# Patient Record
Sex: Female | Born: 2000 | Race: White | Hispanic: No | Marital: Single | Smoking: Never smoker
Health system: Southern US, Community
[De-identification: ages and names within clinical notes are randomized; demographics above are authoritative.]

## PROBLEM LIST (undated history)

## (undated) DIAGNOSIS — F909 Attention-deficit hyperactivity disorder, unspecified type: Secondary | ICD-10-CM

## (undated) DIAGNOSIS — F32A Depression, unspecified: Secondary | ICD-10-CM

## (undated) DIAGNOSIS — F419 Anxiety disorder, unspecified: Secondary | ICD-10-CM

## (undated) HISTORY — DX: Attention-deficit hyperactivity disorder, unspecified type: F90.9

## (undated) HISTORY — DX: Anxiety disorder, unspecified: F41.9

## (undated) HISTORY — DX: Depression, unspecified: F32.A

---

## 2021-06-03 ENCOUNTER — Ambulatory Visit (INDEPENDENT_AMBULATORY_CARE_PROVIDER_SITE_OTHER): Payer: BC Managed Care – PPO | Admitting: Internal Medicine

## 2021-06-03 ENCOUNTER — Encounter: Payer: Self-pay | Admitting: Internal Medicine

## 2021-06-03 VITALS — BP 116/68 | HR 84 | Temp 97.4°F | Resp 16 | Ht 65.0 in | Wt 137.3 lb

## 2021-06-03 DIAGNOSIS — Z114 Encounter for screening for human immunodeficiency virus [HIV]: Secondary | ICD-10-CM | POA: Diagnosis not present

## 2021-06-03 DIAGNOSIS — R61 Generalized hyperhidrosis: Secondary | ICD-10-CM | POA: Diagnosis not present

## 2021-06-03 DIAGNOSIS — L853 Xerosis cutis: Secondary | ICD-10-CM | POA: Diagnosis not present

## 2021-06-03 DIAGNOSIS — Z23 Encounter for immunization: Secondary | ICD-10-CM | POA: Diagnosis not present

## 2021-06-03 DIAGNOSIS — F419 Anxiety disorder, unspecified: Secondary | ICD-10-CM | POA: Diagnosis not present

## 2021-06-03 DIAGNOSIS — Z1159 Encounter for screening for other viral diseases: Secondary | ICD-10-CM | POA: Diagnosis not present

## 2021-06-03 NOTE — Patient Instructions (Addendum)
It was great seeing you today!  Plan discussed at today's visit: -Blood work ordered today, results will be uploaded to [facility].  -Tdap booster given today  Follow up in: as needed, schedule appointment for Pap whenever is convenient    Take care and let us know if you have any questions or concerns prior to your next visit.  Dr. Rosana Berger

## 2021-06-03 NOTE — Progress Notes (Signed)
New Patient Office Visit  Subjective:  Patient ID: Kaitlin Johnson, female    DOB: [birth year]  Age: 21 y.o. MRN: [id number]  CC:  Chief Complaint  Patient presents with   Establish Care   Alopecia    Scalp dry/flacky/itchy   Excessive Sweating    HPI Kaitlin Johnson presents as a new patient. Past medical history includes MDD/anxiety and ADHD. She currently is not on any daily medications. 2 separate issues today: Dry skin on scalp and excessive sweating, particularly at night.   Dry skin: Noticed lately, worse on scalp, concern for psoriasis. Scalp is oily and is currently washing it everyday but then becomes dry, scaly and itchy. Switching shampoos but hasn't noticed too much of a change. Just washed hair so no issues currently.  Excessive sweating: like hot flashes, worse at night. Happens almost every night for 1 year. Not getting worse or better. Having some irregular periods but does have a period about every 28 days, some heavy bleeding, lasts about 5-6 days. Was previously on OCP which regulated periods but she stopped taking this in 2019 and does not want to be on birth control. LMP 3 days ago. Does have a history of anxiety which has been a little worse since moving to [place] 2 years ago from [place].   Health Maintenance: -Blood work due -Never had a Pap, up to date with HPV vaccines -Politely declines flu shot today but due to Tdap booster  Past Medical History:  Diagnosis Date   ADHD    Anxiety    Depression     History reviewed. No pertinent surgical history.   History reviewed. No pertinent family history. Heart disease   Social History   Socioeconomic History   Marital status: Single    Spouse name: Not on file   Number of children: Not on file   Years of education: Not on file   Highest education level: Not on file  Occupational History   Not on file  Tobacco Use   Smoking status: Never   Smokeless tobacco: Never  Vaping Use   Vaping Use: Some days    Substances: Nicotine  Substance and Sexual Activity   Alcohol use: Yes    Comment: 10 glasses of wine or beer per week   Drug use: Yes    Types: Marijuana   Sexual activity: Yes  Other Topics Concern   Not on file  Social History Narrative   Not on file   Social Determinants of Health   Financial Resource Strain: Not on file  Food Insecurity: Not on file  Transportation Needs: Not on file  Physical Activity: Not on file  Stress: Not on file  Social Connections: Not on file  Intimate Partner Violence: Not on file    ROS Review of Systems  Constitutional:  Positive for fatigue. Negative for chills, fever and unexpected weight change.  Eyes:  Negative for visual disturbance.  Respiratory:  Negative for cough.   Cardiovascular:  Negative for chest pain.  Gastrointestinal:  Negative for abdominal pain.  Genitourinary:  Positive for vaginal bleeding. Negative for menstrual problem.  Neurological:  Negative for dizziness and headaches.   Objective:   Today's Vitals: BP 116/68    Pulse 84    Temp (!) 97.4 F (36.3 C)    Resp 16    Ht 5\' 5"  (1.651 m)    Wt 137 lb 4.8 oz (62.3 kg)    LMP 06/01/2021    SpO2 98%  BMI 22.85 kg/m   Physical Exam Constitutional:      Appearance: Normal appearance.  HENT:     Head: Normocephalic and atraumatic.  Eyes:     Conjunctiva/sclera: Conjunctivae normal.  Cardiovascular:     Rate and Rhythm: Normal rate and regular rhythm.  Pulmonary:     Effort: Pulmonary effort is normal.     Breath sounds: Normal breath sounds.  Musculoskeletal:     Right lower leg: No edema.     Left lower leg: No edema.  Skin:    General: Skin is warm and dry.     Comments: Scalp clean without excoriations, scabbing, scales or skin changes. Does not appear particularly dry, no flaking but she just washed hair.   Neurological:     General: No focal deficit present.     Mental Status: She is alert. Mental status is at baseline.  Psychiatric:        Mood and  Affect: Mood normal.        Behavior: Behavior normal.    Assessment & Plan:   1. Excessive sweating/Anxiety: Night sweats primarily. Will obtain routine blood work to rule out metabolic causes but discussed the possibly of anxiety manifesting in this way. Discussed medications but most importantly considering counseling or therapy.  - CBC w/Diff/Platelet - COMPLETE METABOLIC PANEL WITH GFR - TSH  2. Dry skin: No evidence of dry skin or skin pathology on scalp currently although she did just wash it. Consider Selsun blue shampoo and washing hair everyday with a good conditioner/and or scalp oil. Check TSH to rule out thyroid disease.  - TSH  3. Need for hepatitis C screening test/Encounter for screening for HIV: Screen for HIV/Hepatitis C with above labs.   - Hepatitis C Antibody - HIV antibody (with reflex)  4. Need for Tdap vaccination: Tdap booster today.  - Tdap vaccine greater than or equal to 7yo IM   Follow-up: Return if symptoms worsen or fail to improve, for schedule Pap whenever works for her.   Teodora Medici, DO

## 2021-06-04 LAB — CBC WITH DIFFERENTIAL/PLATELET
Absolute Monocytes: 832 cells/uL (ref 200–950)
Basophils Absolute: 13 cells/uL (ref 0–200)
Basophils Relative: 0.2 %
Eosinophils Absolute: 39 cells/uL (ref 15–500)
Eosinophils Relative: 0.6 %
HCT: 38.7 % (ref 35.0–45.0)
Hemoglobin: 13.1 g/dL (ref 11.7–15.5)
Lymphs Abs: 2009 cells/uL (ref 850–3900)
MCH: 31.6 pg (ref 27.0–33.0)
MCHC: 33.9 g/dL (ref 32.0–36.0)
MCV: 93.5 fL (ref 80.0–100.0)
MPV: 11.2 fL (ref 7.5–12.5)
Monocytes Relative: 12.8 %
Neutro Abs: 3608 cells/uL (ref 1500–7800)
Neutrophils Relative %: 55.5 %
Platelets: 290 10*3/uL (ref 140–400)
RBC: 4.14 10*6/uL (ref 3.80–5.10)
RDW: 12.3 % (ref 11.0–15.0)
Total Lymphocyte: 30.9 %
WBC: 6.5 10*3/uL (ref 3.8–10.8)

## 2021-06-04 LAB — COMPLETE METABOLIC PANEL WITH GFR
AG Ratio: 1.9 (calc) (ref 1.0–2.5)
ALT: 12 U/L (ref 6–29)
AST: 20 U/L (ref 10–30)
Albumin: 5 g/dL (ref 3.6–5.1)
Alkaline phosphatase (APISO): 36 U/L (ref 31–125)
BUN: 12 mg/dL (ref 7–25)
CO2: 26 mmol/L (ref 20–32)
Calcium: 10 mg/dL (ref 8.6–10.2)
Chloride: 104 mmol/L (ref 98–110)
Creat: 0.72 mg/dL (ref 0.50–0.96)
Globulin: 2.6 g/dL (calc) (ref 1.9–3.7)
Glucose, Bld: 80 mg/dL (ref 65–99)
Potassium: 4.3 mmol/L (ref 3.5–5.3)
Sodium: 140 mmol/L (ref 135–146)
Total Bilirubin: 0.9 mg/dL (ref 0.2–1.2)
Total Protein: 7.6 g/dL (ref 6.1–8.1)
eGFR: 122 mL/min/{1.73_m2} (ref >=60)

## 2021-06-04 LAB — HIV ANTIBODY (ROUTINE TESTING W REFLEX): HIV 1&2 Ab, 4th Generation: NONREACTIVE

## 2021-06-04 LAB — HEPATITIS C ANTIBODY
Hepatitis C Ab: NONREACTIVE
SIGNAL TO CUT-OFF: 0.07 (ref <1.00)

## 2021-06-04 LAB — TSH: TSH: 1.12 mIU/L

## 2021-06-24 ENCOUNTER — Ambulatory Visit (INDEPENDENT_AMBULATORY_CARE_PROVIDER_SITE_OTHER): Payer: BC Managed Care – PPO | Admitting: Internal Medicine

## 2021-06-24 ENCOUNTER — Other Ambulatory Visit (HOSPITAL_COMMUNITY)
Admission: RE | Admit: 2021-06-24 | Discharge: 2021-06-24 | Disposition: A | Discharge status: Home / Self Care | Payer: BC Managed Care – PPO | Source: Ambulatory Visit | Attending: Internal Medicine | Admitting: Internal Medicine

## 2021-06-24 ENCOUNTER — Encounter: Payer: Self-pay | Admitting: Internal Medicine

## 2021-06-24 VITALS — BP 118/78 | HR 87 | Temp 98.2°F | Resp 16 | Ht 65.0 in | Wt 138.2 lb

## 2021-06-24 DIAGNOSIS — R87612 Low grade squamous intraepithelial lesion on cytologic smear of cervix (LGSIL): Secondary | ICD-10-CM | POA: Diagnosis not present

## 2021-06-24 DIAGNOSIS — Z124 Encounter for screening for malignant neoplasm of cervix: Secondary | ICD-10-CM

## 2021-06-24 NOTE — Progress Notes (Signed)
Established Patient Office Visit  Subjective:  Patient ID: Kaitlin Johnson, female    DOB: 2001-01-25  Age: 21 y.o. MRN: [id number]  CC:  Chief Complaint  Patient presents with   Gynecologic Exam    HPI Kaitlin Johnson presents for Pap.   SUBJECTIVE:  21 y.o. female for annual routine Pap and checkup. Periods are regular without issues. Denies concerns for STDs. Denies abnormal vaginal pain or discharge. Overall no concerns.   Past Medical History:  Diagnosis Date   ADHD    Anxiety    Depression     Social History   Socioeconomic History   Marital status: Single    Spouse name: Not on file   Number of children: Not on file   Years of education: Not on file   Highest education level: Not on file  Occupational History   Not on file  Tobacco Use   Smoking status: Never   Smokeless tobacco: Never  Vaping Use   Vaping Use: Some days   Substances: Nicotine  Substance and Sexual Activity   Alcohol use: Yes    Comment: 10 glasses of wine or beer per week   Drug use: Yes    Types: Marijuana   Sexual activity: Yes  Other Topics Concern   Not on file  Social History Narrative   Not on file   Social Determinants of Health   Financial Resource Strain: Not on file  Food Insecurity: Not on file  Transportation Needs: Not on file  Physical Activity: Not on file  Stress: Not on file  Social Connections: Not on file  Intimate Partner Violence: Not on file    No outpatient medications prior to visit.   No facility-administered medications prior to visit.    Allergies: Patient has no known allergies.  Patient's last menstrual period was 06/01/2021.  ROS:  Feeling well. No dyspnea or chest pain on exertion.  No abdominal pain, change in bowel habits, black or bloody stools.  No urinary tract symptoms. GYN ROS: normal menses, no abnormal bleeding, pelvic pain or discharge. No neurological complaints.    Lab Results  Component Value Date   TSH 1.12 06/03/2021    Lab Results  Component Value Date   WBC 6.5 06/03/2021   HGB 13.1 06/03/2021   HCT 38.7 06/03/2021   MCV 93.5 06/03/2021   PLT 290 06/03/2021   Lab Results  Component Value Date   NA 140 06/03/2021   K 4.3 06/03/2021   CO2 26 06/03/2021   GLUCOSE 80 06/03/2021   BUN 12 06/03/2021   CREATININE 0.72 06/03/2021   BILITOT 0.9 06/03/2021   AST 20 06/03/2021   ALT 12 06/03/2021   PROT 7.6 06/03/2021   CALCIUM 10.0 06/03/2021   EGFR 122 06/03/2021   OBJECTIVE:  Vitals: BP 118/78    Pulse 87    Temp 98.2 F (36.8 C)    Resp 16    Ht 5' 5"  (1.651 m)    Wt 138 lb 3.2 oz (62.7 kg)    LMP 06/01/2021    SpO2 97%    BMI 23.00 kg/m   The patient appears well, alert, oriented x 3, in no distress. ENT normal.  Neck supple. No adenopathy or thyromegaly. PERLA. Lungs are clear, good air entry, no wheezes, rhonchi or rales. S1 and S2 normal, no murmurs, regular rate and rhythm. Abdomen soft without tenderness, guarding, mass or organomegaly. Extremities show no edema, normal peripheral pulses. Neurological is normal, no focal findings.  PELVIC  EXAM: normal external genitalia, vulva, vagina, cervix, uterus and adnexa.    Lab Results  Component Value Date   TSH 1.12 06/03/2021   Lab Results  Component Value Date   WBC 6.5 06/03/2021   HGB 13.1 06/03/2021   HCT 38.7 06/03/2021   MCV 93.5 06/03/2021   PLT 290 06/03/2021   Lab Results  Component Value Date   NA 140 06/03/2021   K 4.3 06/03/2021   CO2 26 06/03/2021   GLUCOSE 80 06/03/2021   BUN 12 06/03/2021   CREATININE 0.72 06/03/2021   BILITOT 0.9 06/03/2021   AST 20 06/03/2021   ALT 12 06/03/2021   PROT 7.6 06/03/2021   CALCIUM 10.0 06/03/2021   EGFR 122 06/03/2021   No results found for: CHOL No results found for: HDL No results found for: LDLCALC No results found for: TRIG No results found for: CHOLHDL No results found for: HGBA1C    Assessment & Plan:   1. Cervical cancer screening: Pap performed today. Follow  up in 1 year for routine medical follow up.  - Cytology - PAP  Follow-up: Return in about 1 year (around 06/24/2022).    Teodora Medici, DO

## 2021-06-24 NOTE — Patient Instructions (Addendum)
It was great seeing you today!  Plan discussed at today's visit: -Pap today, we will call you or send a message via MyChart with results.   Follow up in: 1 year for routine medical care or sooner as needed  Take care and let us know if you have any questions or concerns prior to your next visit.  Dr. Caralee Ates

## 2021-06-27 LAB — CYTOLOGY - PAP
Comment: NEGATIVE
High risk HPV: POSITIVE — AB

## 2021-06-28 ENCOUNTER — Telehealth: Payer: Self-pay

## 2021-06-28 NOTE — Telephone Encounter (Signed)
Copied from CRM [id number]. Topic: General - Other >> Jun 28, 2021  3:08 PM Kaitlin Johnson wrote: Reason for CRM: Pt requests return call regarding questions/concerns about her lab results. Cb# [phone]

## 2021-06-28 NOTE — Addendum Note (Signed)
Addended by: Margarita Mail on: 06/28/2021 10:20 AM   Modules accepted: Orders

## 2021-06-28 NOTE — Telephone Encounter (Signed)
Pt.notified

## 2021-07-01 ENCOUNTER — Telehealth: Payer: Self-pay

## 2021-07-01 NOTE — Telephone Encounter (Signed)
[facility] referring for Needs colpo, abnormal Pap with LSIL and HPV. MD only. Called and left voicemail for patient to call back to be scheduled.

## 2021-07-01 NOTE — Telephone Encounter (Signed)
Patient is scheduled 08/05/21 with RPH at 2:55

## 2021-08-05 ENCOUNTER — Ambulatory Visit (INDEPENDENT_AMBULATORY_CARE_PROVIDER_SITE_OTHER): Payer: BC Managed Care – PPO | Admitting: Obstetrics & Gynecology

## 2021-08-05 ENCOUNTER — Encounter: Payer: Self-pay | Admitting: Obstetrics & Gynecology

## 2021-08-05 ENCOUNTER — Other Ambulatory Visit: Payer: Self-pay

## 2021-08-05 ENCOUNTER — Other Ambulatory Visit (HOSPITAL_COMMUNITY)
Admission: RE | Admit: 2021-08-05 | Discharge: 2021-08-05 | Disposition: A | Discharge status: Home / Self Care | Payer: BC Managed Care – PPO | Source: Ambulatory Visit | Attending: Obstetrics & Gynecology | Admitting: Obstetrics & Gynecology

## 2021-08-05 VITALS — BP 120/80 | Ht 65.0 in | Wt 141.0 lb

## 2021-08-05 DIAGNOSIS — N870 Mild cervical dysplasia: Secondary | ICD-10-CM | POA: Diagnosis not present

## 2021-08-05 DIAGNOSIS — R87612 Low grade squamous intraepithelial lesion on cytologic smear of cervix (LGSIL): Secondary | ICD-10-CM | POA: Diagnosis not present

## 2021-08-05 NOTE — Progress Notes (Signed)
?  Referring Provider:  Dr Caralee Ates ? ?HPI:  ?Kaitlin Johnson is a 21 y.o.  No obstetric history on file.  who presents today for evaluation and management of abnormal cervical cytology.   ? ?Dysplasia History:  LGSIL first PAP ?Also, HPV POS ? ?Pt also reports 2 mos h/o intermittent thigh,mons,labial lesions, that are small and do not itch or hurt ? ?ROS:  Pertinent items are noted in HPI. ? ?OB History  ?No obstetric history on file.  ? ? ?Past Medical History:  ?Diagnosis Date  ? ADHD   ? Anxiety   ? Depression   ? ? ?History reviewed. No pertinent surgical history. ? ?SOCIAL HISTORY: ? ?Social History  ? ?Substance and Sexual Activity  ?Alcohol Use Yes  ? Comment: 10 glasses of wine or beer per week  ? ? ?Social History  ? ?Substance and Sexual Activity  ?Drug Use Yes  ? Types: Marijuana  ? ? ? ?History reviewed. No pertinent family history. ? ?ALLERGIES:  Patient has no known allergies. ? ?No current outpatient medications on file prior to visit.  ? ?No current facility-administered medications on file prior to visit.  ? ? ?Physical Exam: ?-Vitals:  ?BP 120/80   Ht 5\' 5"  (1.651 m)   Wt 141 lb (64 kg)   LMP 07/23/2021   BMI 23.46 kg/m?  ?GEN: WD, WN, NAD.  A+ O x 3, good mood and affect. ?ABD:  NT, ND.  Soft, no masses.  No hernias noted. ?  Pelvic:   ?Vulva: Normal appearance.  3 small skin lesions noted, NOT c/w GENITAL WARTS or MOLLUSCUM.  ?Vagina: No lesions or abnormalities noted.  ?Support: Normal pelvic support.  ?Urethra No masses tenderness or scarring.  ?Meatus Normal size without lesions or prolapse.  ?Cervix: See below.  ?Anus: Normal exam.  No lesions.  ?Perineum: Normal exam.  No lesions.  ?      Bimanual   ?Uterus: Normal size.  Non-tender.  Mobile.  AV.  ?Adnexae: No masses.  Non-tender to palpation.  ?Cul-de-sac: Negative for abnormality.  ? ?PROCEDURE: ?1.  Urine Pregnancy Test:  not done ?2.  Colposcopy performed with 4% acetic acid after verbal consent obtained ?             ?              ?              -Aceto-white Lesions Location(s): none. ?             -Biopsy performed at 6, 12 o'clock  ?             -ECC indicated and performed: Yes.   ?    -Biopsy sites made hemostatic with pressure, AgNO3, and/or Monsel's solution ?  -Satisfactory colposcopy: Yes.    ?  -Evidence of Invasive cervical CA :  NO ? ?ASSESSMENT:  Kaitlin Johnson is a 21 y.o. No obstetric history on file. here for  ?1. Low grade squamous intraepith lesion on cytologic smear cervix (lgsil)   ?. ? ?PLAN: ?1.  I discussed the grading system of pap smears and HPV high risk viral types.  We will discuss and base management after colpo results return. ?2. Follow up PAP 6 months, vs intervention if high grade dysplasia identified ?3.  Skin lesion, do not appear to be HPV or STD related.   ?    ?36, MD, FACOG ?[facility], [facility] ?08/05/2021  3:23 PM ? ?

## 2021-08-05 NOTE — Patient Instructions (Signed)
Colposcopy, Care After ?The following information offers guidance on how to care for yourself after your procedure. Your health care provider may also give you more specific instructions. If you have problems or questions, contact your health care provider. ?What can I expect after the procedure? ?If you had a colposcopy without a biopsy, you can expect to feel fine right away after your procedure. However, you may have some spotting of blood for a few days. You can return to your normal activities. ?If you had a colposcopy with a biopsy, it is common after the procedure to have: ?Soreness and mild pain. These may last for a few days. ?Mild vaginal bleeding or discharge that is dark-colored and grainy. This may last for a few days. The discharge may be caused by a liquid (solution) that was used during the procedure. You may need to wear a sanitary pad during this time. ?Spotting of blood for at least 48 hours after the procedure. ?Follow these instructions at home: ?Medicines ?Take over-the-counter and prescription medicines only as told by your health care provider. ?Talk with your health care provider about what type of over-the-counter pain medicines and prescription medicines you can start to take again. It is especially important to talk with your health care provider if you take blood thinners. ?Activity ?Avoid using douche products, using tampons, and having sex for at least 3 days after the procedure or for as long as told by your health care provider. ?Return to your normal activities as told by your health care provider. Ask your health care provider what activities are safe for you. ?General instructions ?Ask your health care provider if you may take baths, swim, or use a hot tub. You may take showers. ?If you use birth control (contraception), continue to use it. ?Keep all follow-up visits. This is important. ?Contact a health care provider if: ?You have a fever or chills. ?You faint or feel  light-headed. ?Get help right away if: ?You have heavy bleeding from your vagina or pass blood clots. Heavy bleeding is bleeding that soaks through a sanitary pad in less than 1 hour. ?You have vaginal discharge that is abnormal, is yellow in color, or smells bad. This could be a sign of infection. ?You have severe pain or cramps in your lower abdomen that do not go away with medicine. ?Summary ?If you had a colposcopy without a biopsy, you can expect to feel fine right away, but you may have some spotting of blood for a few days. You can return to your normal activities. ?If you had a colposcopy with a biopsy, it is common to have mild pain for a few days and spotting for 48 hours after the procedure. ?Avoid using douche products, using tampons, and having sex for at least 3 days after the procedure or for as long as told by your health care provider. ?Get help right away if you have heavy bleeding, severe pain, or signs of infection. ?This information is not intended to replace advice given to you by your health care provider. Make sure you discuss any questions you have with your health care provider. ?Document Revised: 10/07/2020 Document Reviewed: 10/07/2020 ?Elsevier Patient Education ? 2022 Elsevier Inc. ? ?

## 2021-08-07 LAB — SURGICAL PATHOLOGY

## 2021-08-08 ENCOUNTER — Telehealth: Payer: Self-pay

## 2021-08-08 NOTE — Telephone Encounter (Signed)
Patient is calling to speak with Dr. Kenton Kingfisher about her biopsy results that she has received in her mychart. Patient is aware [facility] in out of the office on OR- Call. Please advise?

## 2021-08-08 NOTE — Telephone Encounter (Signed)
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

## 2021-08-08 NOTE — Telephone Encounter (Signed)
Left message for pt to return call.

## 2021-10-06 DIAGNOSIS — J029 Acute pharyngitis, unspecified: Secondary | ICD-10-CM | POA: Diagnosis not present

## 2021-10-06 DIAGNOSIS — Z20822 Contact with and (suspected) exposure to covid-19: Secondary | ICD-10-CM | POA: Diagnosis not present

## 2021-10-07 ENCOUNTER — Ambulatory Visit: Payer: BC Managed Care – PPO | Admitting: Internal Medicine

## 2021-10-07 NOTE — Progress Notes (Deleted)
   Acute Office Visit  Subjective:     Patient ID: Kaitlin Johnson, female    DOB: April 24, 2001, 21 y.o.   MRN: [id number]  No chief complaint on file.   HPI Patient is in today for left ear pain.   EAR PAIN Duration: {Blank single:19197::"days","weeks","months"} Involved ear(s): {Blank single:19197::"left","right","bilateral"} Severity:  {Blank single:19197::"mild","moderate","severe","1/10","2/10","3/10","4/10","5/10","6/10","7/10","8/10","9/10","10/10"}  Quality:  {Blank multiple:19196::"sharp","dull","aching","burning","cramping","ill-defined","itchy","pressure-like","pulling","shooting","sore","stabbing","tender","tearing","throbbing"} Fever: {Blank single:19197::"yes","no"} Otorrhea: {Blank single:19197::"yes","no"} Upper respiratory infection symptoms: {Blank single:19197::"yes","no"} Pruritus: {Blank single:19197::"yes","no"} Hearing loss: {Blank single:19197::"yes","no"} Water immersion {Blank single:19197::"yes","no"} Using Q-tips: {Blank single:19197::"yes","no"} Recurrent otitis media: {Blank single:19197::"yes","no"} Status: {Blank multiple:19196::"better","worse","stable","fluctuating"} Treatments attempted: {Blank single:19197::"none","pseudoephedrine"}   ROS      Objective:    There were no vitals taken for this visit. {Vitals History (Optional):23777}  Physical Exam  No results found for any visits on 10/07/21.      Assessment & Plan:   Problem List Items Addressed This Visit   None   No orders of the defined types were placed in this encounter.   No follow-ups on file.  Margarita Mail, DO

## 2021-10-08 ENCOUNTER — Other Ambulatory Visit: Payer: Self-pay

## 2021-10-08 ENCOUNTER — Emergency Department (HOSPITAL_COMMUNITY): Payer: BC Managed Care – PPO

## 2021-10-08 ENCOUNTER — Emergency Department (HOSPITAL_COMMUNITY)
Admission: EM | Admit: 2021-10-08 | Discharge: 2021-10-08 | Discharge status: Against Medical Advice | Payer: BC Managed Care – PPO | Attending: Emergency Medicine | Admitting: Emergency Medicine

## 2021-10-08 DIAGNOSIS — Z8616 Personal history of COVID-19: Secondary | ICD-10-CM | POA: Insufficient documentation

## 2021-10-08 DIAGNOSIS — Z5321 Procedure and treatment not carried out due to patient leaving prior to being seen by health care provider: Secondary | ICD-10-CM | POA: Diagnosis not present

## 2021-10-08 DIAGNOSIS — R0789 Other chest pain: Secondary | ICD-10-CM | POA: Insufficient documentation

## 2021-10-08 DIAGNOSIS — R0602 Shortness of breath: Secondary | ICD-10-CM | POA: Diagnosis not present

## 2021-10-08 LAB — I-STAT BETA HCG BLOOD, ED (MC, WL, AP ONLY): I-stat hCG, quantitative: <5 m[IU]/mL (ref <5)

## 2021-10-08 LAB — CBC WITH DIFFERENTIAL/PLATELET
Abs Immature Granulocytes: 0 10*3/uL (ref 0.00–0.07)
Basophils Absolute: 0 10*3/uL (ref 0.0–0.1)
Basophils Relative: 1 %
Eosinophils Absolute: 0 10*3/uL (ref 0.0–0.5)
Eosinophils Relative: 0 %
HCT: 41.6 % (ref 36.0–46.0)
Hemoglobin: 13.9 g/dL (ref 12.0–15.0)
Lymphocytes Relative: 46 %
Lymphs Abs: 1.3 10*3/uL (ref 0.7–4.0)
MCH: 31.3 pg (ref 26.0–34.0)
MCHC: 33.4 g/dL (ref 30.0–36.0)
MCV: 93.7 fL (ref 80.0–100.0)
Monocytes Absolute: 0.6 10*3/uL (ref 0.1–1.0)
Monocytes Relative: 20 %
Myelocytes: 1 %
Neutro Abs: 0.9 10*3/uL — ABNORMAL LOW (ref 1.7–7.7)
Neutrophils Relative %: 32 %
Platelets: 213 10*3/uL (ref 150–400)
RBC: 4.44 MIL/uL (ref 3.87–5.11)
RDW: 12.4 % (ref 11.5–15.5)
WBC: 2.9 10*3/uL — ABNORMAL LOW (ref 4.0–10.5)
nRBC: 0 % (ref 0.0–0.2)
nRBC: 0 /100 WBC

## 2021-10-08 LAB — BASIC METABOLIC PANEL
Anion gap: 9 (ref 5–15)
BUN: 7 mg/dL (ref 6–20)
CO2: 26 mmol/L (ref 22–32)
Calcium: 9.3 mg/dL (ref 8.9–10.3)
Chloride: 103 mmol/L (ref 98–111)
Creatinine, Ser: 0.66 mg/dL (ref 0.44–1.00)
GFR, Estimated: >60 mL/min (ref >60)
Glucose, Bld: 94 mg/dL (ref 70–99)
Potassium: 4.1 mmol/L (ref 3.5–5.1)
Sodium: 138 mmol/L (ref 135–145)

## 2021-10-08 NOTE — ED Triage Notes (Signed)
Pt here from home for centralized chest pain that has been intermittent since yesterday. Pt reports testing COVID + on Sunday (symptoms started Saturday). Pt endorses shob w/ speaking sentences and exertion. Pt denies fevers/chills. ?

## 2021-10-08 NOTE — ED Provider Triage Note (Signed)
Emergency Medicine Provider Triage Evaluation Note ? ?Kaitlin Johnson , a 21 y.o. female  was evaluated in triage.  Pt complains of central chest pain.  Intermittent since yesterday.  Hx of ADHD, anxiety, depression.  Symptoms described as transient.  Shortness of breath when speaking for long periods.  Denies abdominal pain, fevers, chills, vision changes, N/V. ? ?Review of Systems  ?Positive: As above ?Negative: As above ? ?Physical Exam  ?BP (!) 145/102 (BP Location: Right Arm)   Pulse 94   Temp 99.3 ?F (37.4 ?C) (Oral)   Resp 18   LMP 09/27/2021 (Approximate)   SpO2 98%  ?Gen:   Awake, no distress, sitting comfortably ?Resp:  Normal effort, CTAB ?MSK:   Moves extremities without difficulty  ?Other:  Chest non-ttp.  RRR w/o M/R/G.  Afebrile.  No LE edema bilaterally. ? ?Medical Decision Making  ?Medically screening exam initiated at 6:49 PM.  Appropriate orders placed.  Kaitlin Johnson was informed that the remainder of the evaluation will be completed by another provider, this initial triage assessment does not replace that evaluation, and the importance of remaining in the ED until their evaluation is complete. ? ?Labs, imaging, EKG ordered ?  ?Cecil Cobbs, PA-C ?10/08/21 1853 ? ?

## 2021-10-09 ENCOUNTER — Encounter: Payer: Self-pay | Admitting: Internal Medicine

## 2021-10-09 ENCOUNTER — Telehealth (INDEPENDENT_AMBULATORY_CARE_PROVIDER_SITE_OTHER): Payer: BC Managed Care – PPO | Admitting: Internal Medicine

## 2021-10-09 ENCOUNTER — Telehealth: Payer: Self-pay

## 2021-10-09 DIAGNOSIS — H938X3 Other specified disorders of ear, bilateral: Secondary | ICD-10-CM | POA: Diagnosis not present

## 2021-10-09 DIAGNOSIS — R051 Acute cough: Secondary | ICD-10-CM

## 2021-10-09 DIAGNOSIS — U071 COVID-19: Secondary | ICD-10-CM

## 2021-10-09 LAB — PATHOLOGIST SMEAR REVIEW

## 2021-10-09 MED ORDER — BENZONATATE 100 MG PO CAPS: 100.0000 mg | ORAL_CAPSULE | Freq: Two times a day (BID) | ORAL | 0 refills | Status: DC | PRN

## 2021-10-09 MED ORDER — FLUTICASONE PROPIONATE 50 MCG/ACT NA SUSP
2.0000 | Freq: Every day | NASAL | 6 refills | Status: DC
Start: 2021-10-09 — End: 2022-09-01

## 2021-10-09 NOTE — Patient Instructions (Signed)
It was great seeing you today! ? ?Plan discussed at today's visit: ?-Flonase and cough suppressant sent to pharmacy  ?-Please obtain a pulse oximeter to monitor oxygen levels at home, normal is 95-100, if your oxygen level is <88 and not coming back up to normal, please go to the ER ? ?Follow up in: as needed ? ?Take care and let us know if you have any questions or concerns prior to your next visit. ? ?Dr. Rosana Berger ? ?

## 2021-10-09 NOTE — Progress Notes (Signed)
Virtual Visit via Video Note ? ?I connected with Kaitlin Johnson on 10/09/21 at  2:00 PM EDT by a video enabled telemedicine application and verified that I am speaking with the correct person using two identifiers. ? ?Location: ?Patient: Home ?Provider: [facility] ?  ?I discussed the limitations of evaluation and management by telemedicine and the availability of in person appointments. The patient expressed understanding and agreed to proceed. ? ?History of Present Illness: ? ?Kaitlin Johnson is a 21 year old female presenting via telemedicine for complaints of chest pain and shortness of breath. She tested postiive for COVID on 5/14 and her symptoms started 5/12 with runny nose, dry cough. She was seen in the ER yesterday for the same issue and a chest x-ray was obtained, which was negative for acute cardiopulmonary process. EKG normal sinus rhythm. Labs showing WBC low at 2.9, absolute neutrophils 0.9. BMP unremarkable. Today she states she is starting to feel better and her chest pain is resolving. She does have some shortness of breath that she feels is contributed to anxiety. She also has some dry cough and bilateral ear pressure. No fevers, sinus pain/pressure, sore throat, ear pain.  ? ? ?Observations/Objective: ? ?General: well appearing, no acute distress ?ENT: conjunctiva normal appearing bilaterally  ?Neuro: answers questions appropriately  ? ?Assessment and Plan: ? ?1. COVID-19/Acute cough/Ear pressure, bilateral: Tested positive 5/14, symptoms began 5/12. Will treat with Flonase and tessalon perles as needed for cough. Discussed lab results with the patient, plan to recheck CBC when the patient is better to ensure resolution. Also discussed obtaining pulse oximeter to monitor home oxygen levels.  If pulse ox falls below 88% and will not rise back up to normal, she will present to the emergency room.  Was given a work note to return to work on 10/22, 10 days after symptoms first began.  Follow-up if symptoms  worsen or fail to improve. ? ?- fluticasone (FLONASE) 50 MCG/ACT nasal spray; Place 2 sprays into both nostrils daily.  Dispense: 16 g; Refill: 6 ?- benzonatate (TESSALON) 100 MG capsule; Take 1 capsule (100 mg total) by mouth 2 (two) times daily as needed for cough.  Dispense: 20 capsule; Refill: 0 ? ?Follow Up Instructions: If symptoms worsen or fail to improve ? ?  ?I discussed the assessment and treatment plan with the patient. The patient was provided an opportunity to ask questions and all were answered. The patient agreed with the plan and demonstrated an understanding of the instructions. ?  ?The patient was advised to call back or seek an in-person evaluation if the symptoms worsen or if the condition fails to improve as anticipated. ? ?I provided 12 minutes of non-face-to-face time during this encounter. ? ? ?Margarita Mail, DO ? ?

## 2021-10-09 NOTE — Telephone Encounter (Signed)
Pt was seen for virtual and discussed ?

## 2021-10-09 NOTE — Telephone Encounter (Signed)
Copied from CRM [id number]. Topic: General - Call Back - No Documentation >> Oct 09, 2021 12:33 PM Kaitlin Johnson wrote: Reason for CRM: pt would like a call back to go over her results from lab work done at the ED.  Pt has some concerns. Pt has covid, and not feeling much better.

## 2021-11-13 ENCOUNTER — Encounter: Payer: Self-pay | Admitting: Internal Medicine

## 2021-12-09 ENCOUNTER — Encounter: Payer: Self-pay | Admitting: Internal Medicine

## 2021-12-09 ENCOUNTER — Ambulatory Visit (INDEPENDENT_AMBULATORY_CARE_PROVIDER_SITE_OTHER): Payer: BC Managed Care – PPO | Admitting: Internal Medicine

## 2021-12-09 VITALS — BP 124/82 | HR 79 | Temp 98.3°F | Resp 16 | Ht 65.0 in | Wt 134.4 lb

## 2021-12-09 DIAGNOSIS — F331 Major depressive disorder, recurrent, moderate: Secondary | ICD-10-CM | POA: Diagnosis not present

## 2021-12-09 DIAGNOSIS — F419 Anxiety disorder, unspecified: Secondary | ICD-10-CM

## 2021-12-09 MED ORDER — ESCITALOPRAM OXALATE 5 MG PO TABS: 5.0000 mg | ORAL_TABLET | Freq: Every day | ORAL | 1 refills | Status: DC

## 2021-12-09 MED ORDER — HYDROXYZINE HCL 10 MG PO TABS: 10.0000 mg | ORAL_TABLET | Freq: Three times a day (TID) | ORAL | 0 refills | Status: DC | PRN

## 2021-12-09 NOTE — Progress Notes (Signed)
Acute Office Visit  Subjective:     Patient ID: Kaitlin Johnson, female    DOB: 2000/07/04, 21 y.o.   MRN: [id number]  Chief Complaint  Patient presents with   Depression    HPI Patient is in today for worsening anxiety/MDD.   MDD: -Mood status: uncontrolled -Current treatment: Nothing currently Psychotherapy/counseling: No but looking into starting therapy Previous psychiatric medications: wellbutrin, Focalin, clonidine in childhood for ADHD and questionable bipolar diagnosis Depressed mood: yes Anxious mood: yes Anhedonia: yes Significant weight loss or gain: yes Insomnia: yes  Fatigue: yes Feelings of worthlessness or guilt: yes Impaired concentration/indecisiveness: yes Suicidal ideations: no     12/09/2021    1:07 PM 10/09/2021    2:01 PM 06/24/2021   12:59 PM 06/03/2021    2:30 PM  Depression screen PHQ 2/9  Decreased Interest 2 0 0 1  Down, Depressed, Hopeless 3 0 0 1  PHQ - 2 Score 5 0 0 2  Altered sleeping 2 0 0 0  Tired, decreased energy 2 0 0 1  Change in appetite 2 0 0 1  Feeling bad or failure about yourself  0 0 0 1  Trouble concentrating 2 0 0 1  Moving slowly or fidgety/restless 1 0 0 0  Suicidal thoughts 0 0 0 0  PHQ-9 Score 14 0 0 6  Difficult doing work/chores Somewhat difficult Not difficult at all Not difficult at all Somewhat difficult    Review of Systems  Constitutional:  Negative for chills and fever.  Respiratory:  Negative for shortness of breath.   Cardiovascular:  Negative for chest pain.  Psychiatric/Behavioral:  Positive for depression. Negative for suicidal ideas. The patient is nervous/anxious and has insomnia.         Objective:    BP 124/82   Pulse 79   Temp 98.3 F (36.8 C)   Resp 16   Ht 5\' 5"  (1.651 m)   Wt 134 lb 6.4 oz (61 kg)   SpO2 98%   BMI 22.37 kg/m  BP Readings from Last 3 Encounters:  12/09/21 124/82  10/08/21 136/87  08/05/21 120/80   Wt Readings from Last 3 Encounters:  12/09/21 134 lb 6.4 oz (61  kg)  08/05/21 141 lb (64 kg)  06/24/21 138 lb 3.2 oz (62.7 kg)      Physical Exam Constitutional:      Appearance: Normal appearance.  HENT:     Head: Normocephalic and atraumatic.  Eyes:     Conjunctiva/sclera: Conjunctivae normal.  Cardiovascular:     Rate and Rhythm: Normal rate and regular rhythm.  Pulmonary:     Effort: Pulmonary effort is normal.     Breath sounds: Normal breath sounds.  Skin:    General: Skin is warm and dry.  Neurological:     General: No focal deficit present.     Mental Status: She is alert. Mental status is at baseline.  Psychiatric:        Mood and Affect: Mood normal.        Behavior: Behavior normal.     No results found for any visits on 12/09/21.      Assessment & Plan:   1. Moderate episode of recurrent major depressive disorder (HCC)Anxiety: Had been on medications when a child due to ADHD. Not on anything now but did not do well on stimulants in the past. Start Lexapro 5 mg, Hydroxyzine 10 mg PRN. Recommend looking into counseling, referral to social worker placed today to help organize resources.  Follow up for recheck in 6 weeks.   - hydrOXYzine (ATARAX) 10 MG tablet; Take 1 tablet (10 mg total) by mouth 3 (three) times daily as needed for anxiety.  Dispense: 30 tablet; Refill: 0 - AMB Referral to [facility] Coordinaton   Return in about 6 weeks (around 01/20/2022).  Margarita Mail, DO

## 2021-12-09 NOTE — Patient Instructions (Signed)
It was great seeing you today!  Plan discussed at today's visit: -Medication sent to pharmacy - take Lexapro daily and Hydroxyzine as needed   Follow up in: 6 weeks   Take care and let us know if you have any questions or concerns prior to your next visit.  Dr. Caralee Ates

## 2021-12-10 ENCOUNTER — Telehealth: Payer: Self-pay | Admitting: *Deleted

## 2021-12-10 NOTE — Chronic Care Management (AMB) (Signed)
  Care Coordination  Note  12/10/2021 Name: Jennise Both MRN: [id number] DOB: April 12, 2001  [name] is a 21 y.o. year old female who is a primary care patient of Margarita Mail, DO. I reached out to [employer] by phone today to offer care coordination services.      Ms. Janes was given information about Care Coordination services today including:  The Care Coordination services include support from the care team which includes your Nurse Coordinator, Clinical Social Worker, or Pharmacist.  The Care Coordination team is here to help remove barriers to the health concerns and goals most important to you. Care Coordination services are voluntary and the patient may decline or stop services at any time by request to their care team member.   Patient agreed to services and verbal consent obtained.   Follow up plan: Telephone appointment with care coordination team member scheduled for: 12/13/2021  Burman Nieves, [facility] Care Coordination Care Guide Direct Dial: [phone]

## 2021-12-13 ENCOUNTER — Ambulatory Visit: Payer: Self-pay | Admitting: *Deleted

## 2021-12-13 NOTE — Patient Outreach (Signed)
  Care Coordination   Initial Visit Note   12/13/2021 Name: Kaitlin Johnson MRN: [id number] DOB: 16-May-2001  Kaitlin Johnson is a 21 y.o. year old female who sees Margarita Mail, DO for primary care. I spoke with  [facility] by phone today  What matters to the patients health and wellness today?  Referral for mental health follow up   Goals Addressed             This Visit's Progress    Referral for Mental Health Counseling       Care Coordination Interventions:  PHQ2/PHQ9 completed Solution-Focused Strategies employed:  Active listening / Reflection utilized  Emotional Support Provided Participation in counseling encouraged  Suicidal Ideation/Homicidal Ideation assessed: Patient denies thoughts of harm to herself or others Discussed self-care action plan: Patient agreeable to referral to ongoing treatment, discussed having trusted friends that offer support Made referral to [facility]         SDOH assessments and interventions completed:   Yes SDOH Interventions Today    Flowsheet Row Most Recent Value  SDOH Interventions   Depression Interventions/Treatment  Counseling       Care Coordination Interventions Activated:  Yes Care Coordination Interventions:  Yes, provided  Follow up plan: Referral made to [facility]  Encounter Outcome:  Pt. Visit Completed

## 2021-12-13 NOTE — Patient Instructions (Signed)
Visit Information  Thank you for taking time to visit with me today. Please don't hesitate to contact me if I can be of assistance to you.   Following are the goals we discussed today:   Goals Addressed             This Visit's Progress    Referral for Mental Health Counseling       Care Coordination Interventions:  PHQ2/PHQ9 completed Solution-Focused Strategies employed:  Active listening / Reflection utilized  Emotional Support Provided Participation in counseling encouraged  Suicidal Ideation/Homicidal Ideation assessed: Patient denies thoughts of harm to herself or others Discussed self-care action plan: Patient agreeable to referral to ongoing treatment, discussed having trusted friends that offer support Made referral to [facility]         Our next appointment is by telephone on 12/16/21 at 1:30pm  Please call the care guide team at [phone] if you need to cancel or reschedule your appointment.   If you are experiencing a Mental Health or Behavioral Health Crisis or need someone to talk to, please call the Suicide and Crisis Lifeline: 988   Patient verbalizes understanding of instructions and care plan provided today and agrees to view in MyChart. Active MyChart status and patient understanding of how to access instructions and care plan via MyChart confirmed with patient.       Adriana Reams [facility] Care Management [phone]

## 2022-01-14 ENCOUNTER — Encounter: Payer: Self-pay | Admitting: Internal Medicine

## 2022-01-17 ENCOUNTER — Ambulatory Visit: Payer: BC Managed Care – PPO | Admitting: Internal Medicine

## 2022-01-17 ENCOUNTER — Encounter: Payer: Self-pay | Admitting: Internal Medicine

## 2022-01-17 NOTE — Progress Notes (Deleted)
Acute Office Visit  Subjective:     Patient ID: Kaitlin Johnson, female    DOB: 2000-11-10, 21 y.o.   MRN: [id number]  No chief complaint on file.   HPI Patient is in today for worsening anxiety/MDD.   MDD: -Mood status: uncontrolled -Current treatment: Lexapro 5 mg, Hydroxyzine PRN   Psychotherapy/counseling: No but looking into starting therapy Previous psychiatric medications: wellbutrin, Focalin, clonidine in childhood for ADHD and questionable bipolar diagnosis Depressed mood: yes Anxious mood: yes Anhedonia: yes Significant weight loss or gain: yes Insomnia: yes  Fatigue: yes Feelings of worthlessness or guilt: yes Impaired concentration/indecisiveness: yes Suicidal ideations: no     12/13/2021    1:37 PM 12/09/2021    1:07 PM 10/09/2021    2:01 PM 06/24/2021   12:59 PM 06/03/2021    2:30 PM  Depression screen PHQ 2/9  Decreased Interest 2 2 0 0 1  Down, Depressed, Hopeless 3 3 0 0 1  PHQ - 2 Score 5 5 0 0 2  Altered sleeping 2 2 0 0 0  Tired, decreased energy 2 2 0 0 1  Change in appetite 2 2 0 0 1  Feeling bad or failure about yourself  0 0 0 0 1  Trouble concentrating 1 2 0 0 1  Moving slowly or fidgety/restless 0 1 0 0 0  Suicidal thoughts  0 0 0 0  PHQ-9 Score 12 14 0 0 6  Difficult doing work/chores  Somewhat difficult Not difficult at all Not difficult at all Somewhat difficult    Review of Systems  Constitutional:  Negative for chills and fever.  Respiratory:  Negative for shortness of breath.   Cardiovascular:  Negative for chest pain.  Psychiatric/Behavioral:  Positive for depression. Negative for suicidal ideas. The patient is nervous/anxious and has insomnia.         Objective:    There were no vitals taken for this visit. BP Readings from Last 3 Encounters:  12/09/21 124/82  10/08/21 136/87  08/05/21 120/80   Wt Readings from Last 3 Encounters:  12/09/21 134 lb 6.4 oz (61 kg)  08/05/21 141 lb (64 kg)  06/24/21 138 lb 3.2 oz (62.7 kg)       Physical Exam Constitutional:      Appearance: Normal appearance.  HENT:     Head: Normocephalic and atraumatic.  Eyes:     Conjunctiva/sclera: Conjunctivae normal.  Cardiovascular:     Rate and Rhythm: Normal rate and regular rhythm.  Pulmonary:     Effort: Pulmonary effort is normal.     Breath sounds: Normal breath sounds.  Skin:    General: Skin is warm and dry.  Neurological:     General: No focal deficit present.     Mental Status: She is alert. Mental status is at baseline.  Psychiatric:        Mood and Affect: Mood normal.        Behavior: Behavior normal.     No results found for any visits on 01/17/22.      Assessment & Plan:   1. Moderate episode of recurrent major depressive disorder (HCC)Anxiety: Had been on medications when a child due to ADHD. Not on anything now but did not do well on stimulants in the past. Start Lexapro 5 mg, Hydroxyzine 10 mg PRN. Recommend looking into counseling, referral to social worker placed today to help organize resources. Follow up for recheck in 6 weeks.   - hydrOXYzine (ATARAX) 10 MG tablet; Take 1  tablet (10 mg total) by mouth 3 (three) times daily as needed for anxiety.  Dispense: 30 tablet; Refill: 0 - AMB Referral to [facility] Coordinaton   No follow-ups on file.  Margarita Mail, DO

## 2022-01-20 ENCOUNTER — Telehealth (INDEPENDENT_AMBULATORY_CARE_PROVIDER_SITE_OTHER): Payer: BC Managed Care – PPO | Admitting: Internal Medicine

## 2022-01-20 ENCOUNTER — Encounter: Payer: Self-pay | Admitting: Internal Medicine

## 2022-01-20 DIAGNOSIS — F331 Major depressive disorder, recurrent, moderate: Secondary | ICD-10-CM | POA: Diagnosis not present

## 2022-01-20 MED ORDER — ESCITALOPRAM OXALATE 10 MG PO TABS: 10.0000 mg | ORAL_TABLET | Freq: Every day | ORAL | 1 refills | Status: DC

## 2022-01-20 NOTE — Patient Instructions (Signed)
It was great seeing you today!  Plan discussed at today's visit: -Blood work ordered today, results will be uploaded to MyChart.   Follow up in:  Take care and let us know if you have any questions or concerns prior to your next visit.  Dr. Larsen Dungan  

## 2022-01-20 NOTE — Progress Notes (Signed)
Virtual Visit via Video Note  I connected with Kaitlin Johnson on 01/20/22 at  3:00 PM EDT by a video enabled telemedicine application and verified that I am speaking with the correct person using two identifiers.  Location: Patient: Home Provider: [facility]   I discussed the limitations of evaluation and management by telemedicine and the availability of in person appointments. The patient expressed understanding and agreed to proceed.  History of Present Illness:  Patient is presenting via telemedicine for follow up on depression and medications.   MDD: -Mood status: better; feels like the medication is helping but it could be improved  -Current treatment: Lexapro 5 mg, Hydroxyzine PRN -Satisfied with current treatment?: no -Symptom severity: moderate  -Duration of current treatment :  6 weeks  -Side effects: no Medication compliance: excellent compliance Psychotherapy/counseling: no  but interested  Previous psychiatric medications: Wellbutrin, Focalin and Clonidine in childhood for ADHD and questionable bipolar diagnosis     01/20/2022    3:01 PM 12/13/2021    1:37 PM 12/09/2021    1:07 PM 10/09/2021    2:01 PM 06/24/2021   12:59 PM  Depression screen PHQ 2/9  Decreased Interest 1 2 2  0 0  Down, Depressed, Hopeless 1 3 3  0 0  PHQ - 2 Score 2 5 5  0 0  Altered sleeping 0 2 2 0 0  Tired, decreased energy 2 2 2  0 0  Change in appetite 1 2 2  0 0  Feeling bad or failure about yourself  1 0 0 0 0  Trouble concentrating 0 1 2 0 0  Moving slowly or fidgety/restless 0 0 1 0 0  Suicidal thoughts 0  0 0 0  PHQ-9 Score 6 12 14  0 0  Difficult doing work/chores Not difficult at all  Somewhat difficult Not difficult at all Not difficult at all    Observations/Objective:  General: well appearing, no acute distress ENT: conjunctiva normal appearing bilaterally  Skin: no rashes, cyanosis or abnormal bruising noted Neuro: answers all questions appropriately Psych: mood normal, behavior  normal   Assessment and Plan:  1. Moderate episode of recurrent major depressive disorder (HCC): Will increase Lexapro to 10 mg daily. Recheck in 6 weeks virtually for follow up.   - escitalopram (LEXAPRO) 10 MG tablet; Take 1 tablet (10 mg total) by mouth daily.  Dispense: 30 tablet; Refill: 1   Follow Up Instructions: 6 weeks can be virtual     I discussed the assessment and treatment plan with the patient. The patient was provided an opportunity to ask questions and all were answered. The patient agreed with the plan and demonstrated an understanding of the instructions.   The patient was advised to call back or seek an in-person evaluation if the symptoms worsen or if the condition fails to improve as anticipated.  I provided 6 minutes of non-face-to-face time during this encounter.   , DO

## 2022-01-20 NOTE — Progress Notes (Deleted)
Acute Office Visit  Subjective:     Patient ID: Kaitlin Johnson, female    DOB: September 13, 2000, 21 y.o.   MRN: [id number]  No chief complaint on file.   HPI Patient is in today for worsening anxiety/MDD.   MDD: -Mood status: uncontrolled -Current treatment: Lexapro 5 mg, Hydroxyzine PRN   Psychotherapy/counseling: No but looking into starting therapy Previous psychiatric medications: wellbutrin, Focalin, clonidine in childhood for ADHD and questionable bipolar diagnosis Depressed mood: yes Anxious mood: yes Anhedonia: yes Significant weight loss or gain: yes Insomnia: yes  Fatigue: yes Feelings of worthlessness or guilt: yes Impaired concentration/indecisiveness: yes Suicidal ideations: no     12/13/2021    1:37 PM 12/09/2021    1:07 PM 10/09/2021    2:01 PM 06/24/2021   12:59 PM 06/03/2021    2:30 PM  Depression screen PHQ 2/9  Decreased Interest 2 2 0 0 1  Down, Depressed, Hopeless 3 3 0 0 1  PHQ - 2 Score 5 5 0 0 2  Altered sleeping 2 2 0 0 0  Tired, decreased energy 2 2 0 0 1  Change in appetite 2 2 0 0 1  Feeling bad or failure about yourself  0 0 0 0 1  Trouble concentrating 1 2 0 0 1  Moving slowly or fidgety/restless 0 1 0 0 0  Suicidal thoughts  0 0 0 0  PHQ-9 Score 12 14 0 0 6  Difficult doing work/chores  Somewhat difficult Not difficult at all Not difficult at all Somewhat difficult    Review of Systems  Constitutional:  Negative for chills and fever.  Respiratory:  Negative for shortness of breath.   Cardiovascular:  Negative for chest pain.  Psychiatric/Behavioral:  Positive for depression. Negative for suicidal ideas. The patient is nervous/anxious and has insomnia.         Objective:    There were no vitals taken for this visit. BP Readings from Last 3 Encounters:  12/09/21 124/82  10/08/21 136/87  08/05/21 120/80   Wt Readings from Last 3 Encounters:  12/09/21 134 lb 6.4 oz (61 kg)  08/05/21 141 lb (64 kg)  06/24/21 138 lb 3.2 oz (62.7 kg)       Physical Exam Constitutional:      Appearance: Normal appearance.  HENT:     Head: Normocephalic and atraumatic.  Eyes:     Conjunctiva/sclera: Conjunctivae normal.  Cardiovascular:     Rate and Rhythm: Normal rate and regular rhythm.  Pulmonary:     Effort: Pulmonary effort is normal.     Breath sounds: Normal breath sounds.  Skin:    General: Skin is warm and dry.  Neurological:     General: No focal deficit present.     Mental Status: She is alert. Mental status is at baseline.  Psychiatric:        Mood and Affect: Mood normal.        Behavior: Behavior normal.     No results found for any visits on 01/20/22.      Assessment & Plan:   1. Moderate episode of recurrent major depressive disorder (HCC)Anxiety: Had been on medications when a child due to ADHD. Not on anything now but did not do well on stimulants in the past. Start Lexapro 5 mg, Hydroxyzine 10 mg PRN. Recommend looking into counseling, referral to social worker placed today to help organize resources. Follow up for recheck in 6 weeks.   - hydrOXYzine (ATARAX) 10 MG tablet; Take 1  tablet (10 mg total) by mouth 3 (three) times daily as needed for anxiety.  Dispense: 30 tablet; Refill: 0 - AMB Referral to [facility]   No follow-ups on file.  Margarita Mail, DO

## 2022-03-03 ENCOUNTER — Telehealth: Payer: Self-pay | Admitting: Internal Medicine

## 2022-03-03 DIAGNOSIS — F331 Major depressive disorder, recurrent, moderate: Secondary | ICD-10-CM

## 2022-03-04 NOTE — Telephone Encounter (Signed)
Requested Prescriptions  Pending Prescriptions Disp Refills  . escitalopram (LEXAPRO) 5 MG tablet [Pharmacy Med Name: ESCITALOPRAM 5MG  TABLETS] 30 tablet 1    Sig: TAKE 1 TABLET(5 MG) BY MOUTH DAILY     Psychiatry:  Antidepressants - SSRI Passed - 03/03/2022  6:59 AM      Passed - Valid encounter within last 6 months    Recent Outpatient Visits          1 month ago Moderate episode of recurrent major depressive disorder [facility])   [place], [place]   2 months ago Moderate episode of recurrent major depressive disorder [facility])   [facility] Teodora Medici, DO   4 months ago COVID-19   [place], DO   8 months ago LGSIL on Pap smear of cervix   [facility] Teodora Medici, DO   9 months ago Excessive sweating   [facility] Teodora Medici, [place]

## 2022-04-02 ENCOUNTER — Encounter: Payer: Self-pay | Admitting: Internal Medicine

## 2022-04-03 DIAGNOSIS — N390 Urinary tract infection, site not specified: Secondary | ICD-10-CM | POA: Diagnosis not present

## 2022-04-29 ENCOUNTER — Other Ambulatory Visit: Payer: Self-pay | Admitting: Internal Medicine

## 2022-04-29 DIAGNOSIS — F331 Major depressive disorder, recurrent, moderate: Secondary | ICD-10-CM

## 2022-04-29 DIAGNOSIS — F419 Anxiety disorder, unspecified: Secondary | ICD-10-CM

## 2022-04-30 ENCOUNTER — Other Ambulatory Visit: Payer: Self-pay

## 2022-04-30 DIAGNOSIS — F331 Major depressive disorder, recurrent, moderate: Secondary | ICD-10-CM

## 2022-04-30 DIAGNOSIS — F419 Anxiety disorder, unspecified: Secondary | ICD-10-CM

## 2022-04-30 MED ORDER — HYDROXYZINE HCL 10 MG PO TABS: 10.0000 mg | ORAL_TABLET | Freq: Three times a day (TID) | ORAL | 0 refills | Status: DC | PRN

## 2022-05-15 ENCOUNTER — Other Ambulatory Visit: Payer: Self-pay | Admitting: Internal Medicine

## 2022-05-15 DIAGNOSIS — F331 Major depressive disorder, recurrent, moderate: Secondary | ICD-10-CM

## 2022-05-15 DIAGNOSIS — F419 Anxiety disorder, unspecified: Secondary | ICD-10-CM

## 2022-05-20 NOTE — Telephone Encounter (Signed)
Lvm to return call to schedule appt for med refill

## 2022-08-14 ENCOUNTER — Encounter: Payer: Self-pay | Admitting: Internal Medicine

## 2022-08-19 NOTE — Progress Notes (Deleted)
Acute Office Visit  Subjective:     Patient ID: Kaitlin Johnson, female    DOB: 2001-01-10, 22 y.o.   MRN: [id number]  No chief complaint on file.   HPI Patient is in today for form completion. She recently joined the [employer].    MDD: -Mood status: uncontrolled -Current treatment: Nothing currently Psychotherapy/counseling: No but looking into starting therapy Previous psychiatric medications: wellbutrin, Focalin, clonidine in childhood for ADHD and questionable bipolar diagnosis Depressed mood: yes Anxious mood: yes Anhedonia: yes Significant weight loss or gain: yes Insomnia: yes  Fatigue: yes Feelings of worthlessness or guilt: yes Impaired concentration/indecisiveness: yes Suicidal ideations: no     01/20/2022    3:01 PM 12/13/2021    1:37 PM 12/09/2021    1:07 PM 10/09/2021    2:01 PM 06/24/2021   12:59 PM  Depression screen PHQ 2/9  Decreased Interest 1 2 2  0 0  Down, Depressed, Hopeless 1 3 3  0 0  PHQ - 2 Score 2 5 5  0 0  Altered sleeping 0 2 2 0 0  Tired, decreased energy 2 2 2  0 0  Change in appetite 1 2 2  0 0  Feeling bad or failure about yourself  1 0 0 0 0  Trouble concentrating 0 1 2 0 0  Moving slowly or fidgety/restless 0 0 1 0 0  Suicidal thoughts 0  0 0 0  PHQ-9 Score 6 12 14  0 0  Difficult doing work/chores Not difficult at all  Somewhat difficult Not difficult at all Not difficult at all    Review of Systems  Constitutional:  Negative for chills and fever.  Respiratory:  Negative for shortness of breath.   Cardiovascular:  Negative for chest pain.  Psychiatric/Behavioral:  Positive for depression. Negative for suicidal ideas. The patient is nervous/anxious and has insomnia.         Objective:    There were no vitals taken for this visit. BP Readings from Last 3 Encounters:  12/09/21 124/82  10/08/21 136/87  08/05/21 120/80   Wt Readings from Last 3 Encounters:  12/09/21 134 lb 6.4 oz (61 kg)  08/05/21 141 lb (64 kg)  06/24/21 138 lb  3.2 oz (62.7 kg)      Physical Exam Constitutional:      Appearance: Normal appearance.  HENT:     Head: Normocephalic and atraumatic.  Eyes:     Conjunctiva/sclera: Conjunctivae normal.  Cardiovascular:     Rate and Rhythm: Normal rate and regular rhythm.  Pulmonary:     Effort: Pulmonary effort is normal.     Breath sounds: Normal breath sounds.  Skin:    General: Skin is warm and dry.  Neurological:     General: No focal deficit present.     Mental Status: She is alert. Mental status is at baseline.  Psychiatric:        Mood and Affect: Mood normal.        Behavior: Behavior normal.     No results found for any visits on 08/21/22.      Assessment & Plan:   1. Moderate episode of recurrent major depressive disorder (HCC)Anxiety: Had been on medications when a child due to ADHD. Not on anything now but did not do well on stimulants in the past. Start Lexapro 5 mg, Hydroxyzine 10 mg PRN. Recommend looking into counseling, referral to social worker placed today to help organize resources. Follow up for recheck in 6 weeks.   - hydrOXYzine (ATARAX) 10 MG  tablet; Take 1 tablet (10 mg total) by mouth 3 (three) times daily as needed for anxiety.  Dispense: 30 tablet; Refill: 0 - AMB Referral to [place]   No follow-ups on file.  Teodora Medici, DO

## 2022-08-21 ENCOUNTER — Ambulatory Visit: Payer: BC Managed Care – PPO | Admitting: Internal Medicine

## 2022-08-31 NOTE — Progress Notes (Unsigned)
Acute Office Visit  Subjective:     Patient ID: Kaitlin Johnson, female    DOB: 2000/11/02, 22 y.o.   MRN: [id number]  No chief complaint on file.   HPI Patient is in today for form completion. She recently joined the [employer].    MDD: -Mood status: uncontrolled -Current treatment: Nothing currently Psychotherapy/counseling: No but looking into starting therapy Previous psychiatric medications: wellbutrin, Focalin, clonidine in childhood for ADHD and questionable bipolar diagnosis Depressed mood: yes Anxious mood: yes Anhedonia: yes Significant weight loss or gain: yes Insomnia: yes  Fatigue: yes Feelings of worthlessness or guilt: yes Impaired concentration/indecisiveness: yes Suicidal ideations: no     01/20/2022    3:01 PM 12/13/2021    1:37 PM 12/09/2021    1:07 PM 10/09/2021    2:01 PM 06/24/2021   12:59 PM  Depression screen PHQ 2/9  Decreased Interest 1 2 2  0 0  Down, Depressed, Hopeless 1 3 3  0 0  PHQ - 2 Score 2 5 5  0 0  Altered sleeping 0 2 2 0 0  Tired, decreased energy 2 2 2  0 0  Change in appetite 1 2 2  0 0  Feeling bad or failure about yourself  1 0 0 0 0  Trouble concentrating 0 1 2 0 0  Moving slowly or fidgety/restless 0 0 1 0 0  Suicidal thoughts 0  0 0 0  PHQ-9 Score 6 12 14  0 0  Difficult doing work/chores Not difficult at all  Somewhat difficult Not difficult at all Not difficult at all    Review of Systems  Constitutional:  Negative for chills and fever.  Respiratory:  Negative for shortness of breath.   Cardiovascular:  Negative for chest pain.  Psychiatric/Behavioral:  Positive for depression. Negative for suicidal ideas. The patient is nervous/anxious and has insomnia.         Objective:    There were no vitals taken for this visit. BP Readings from Last 3 Encounters:  12/09/21 124/82  10/08/21 136/87  08/05/21 120/80   Wt Readings from Last 3 Encounters:  12/09/21 134 lb 6.4 oz (61 kg)  08/05/21 141 lb (64 kg)  06/24/21 138 lb  3.2 oz (62.7 kg)      Physical Exam Constitutional:      Appearance: Normal appearance.  HENT:     Head: Normocephalic and atraumatic.  Eyes:     Conjunctiva/sclera: Conjunctivae normal.  Cardiovascular:     Rate and Rhythm: Normal rate and regular rhythm.  Pulmonary:     Effort: Pulmonary effort is normal.     Breath sounds: Normal breath sounds.  Skin:    General: Skin is warm and dry.  Neurological:     General: No focal deficit present.     Mental Status: She is alert. Mental status is at baseline.  Psychiatric:        Mood and Affect: Mood normal.        Behavior: Behavior normal.     No results found for any visits on 09/01/22.      Assessment & Plan:   1. Moderate episode of recurrent major depressive disorder (HCC)Anxiety: Had been on medications when a child due to ADHD. Not on anything now but did not do well on stimulants in the past. Start Lexapro 5 mg, Hydroxyzine 10 mg PRN. Recommend looking into counseling, referral to social worker placed today to help organize resources. Follow up for recheck in 6 weeks.   - hydrOXYzine (ATARAX) 10 MG  tablet; Take 1 tablet (10 mg total) by mouth 3 (three) times daily as needed for anxiety.  Dispense: 30 tablet; Refill: 0 - AMB Referral to [facility]   No follow-ups on file.  Teodora Medici, DO

## 2022-09-01 ENCOUNTER — Encounter: Payer: Self-pay | Admitting: Internal Medicine

## 2022-09-01 ENCOUNTER — Ambulatory Visit (INDEPENDENT_AMBULATORY_CARE_PROVIDER_SITE_OTHER): Payer: BC Managed Care – PPO | Admitting: Internal Medicine

## 2022-09-01 VITALS — BP 128/80 | HR 104 | Temp 98.2°F | Resp 16 | Ht 65.0 in | Wt 126.9 lb

## 2022-09-01 DIAGNOSIS — F3342 Major depressive disorder, recurrent, in full remission: Secondary | ICD-10-CM | POA: Diagnosis not present

## 2022-09-02 ENCOUNTER — Ambulatory Visit: Payer: BC Managed Care – PPO | Admitting: Internal Medicine

## 2022-09-14 NOTE — Progress Notes (Unsigned)
Name: Kaitlin Johnson   MRN: [id number]    DOB: May 21, 2001   Date:09/15/2022       Progress Note  Subjective  Chief Complaint  Chief Complaint  Patient presents with   Annual Exam    HPI  Patient presents for annual CPE. Also wanting to discuss drug screen needed for enrollment in the [employer].  Diet: well rounded  Exercise: floor exercises, running, weights 3-4 times a week   Last Eye Exam: no Last Dental Exam: UTD  Flowsheet Row Care Coordination from 12/13/2021 in [employer] [facility]  AUDIT-C Score 10      Depression: Phq 9 is  negative    09/15/2022    8:44 AM 09/01/2022    2:21 PM 01/20/2022    3:01 PM 12/13/2021    1:37 PM 12/09/2021    1:07 PM  Depression screen PHQ 2/9  Decreased Interest 0 0 Down, Depressed, Hopeless 0 0 PHQ - 2 Score 0 0 Altered sleeping 0 0 0 2 2  Tired, decreased energy 0 0 Change in appetite 0 0 Feeling bad or failure about yourself  0 0 1 0 0  Trouble concentrating 0 0 0 1 2  Moving slowly or fidgety/restless 0 0 0 0 1  Suicidal thoughts 0 0 0  0  PHQ-9 Score 0 0 Difficult doing work/chores Not difficult at all Not difficult at all Not difficult at all  Somewhat difficult   Hypertension: BP Readings from Last 3 Encounters:  09/15/22 116/84  09/01/22 128/80  12/09/21 124/82   Obesity: Wt Readings from Last 3 Encounters:  09/15/22 130 lb 14.4 oz (59.4 kg)  09/01/22 126 lb 14.4 oz (57.6 kg)  12/09/21 134 lb 6.4 oz (61 kg)   BMI Readings from Last 3 Encounters:  09/15/22 21.78 kg/m  09/01/22 21.12 kg/m  12/09/21 22.37 kg/m     Vaccines:   HPV: UTD Tdap: 2023 Shingrix: n/a Pneumonia: n/a Flu: politely declines  COVID-19: politely declines    Hep C Screening: 2023 STD testing and prevention (HIV/chl/gon/syphilis): no concerns  Intimate partner violence: negative screen  Menstrual History/LMP/Abnormal Bleeding: regular  Discussed importance of follow  up if any post-menopausal bleeding: not applicable  Incontinence Symptoms: negative for symptoms   Breast cancer: discussed starting screening at age 48 - Last Mammogram: N/a  Osteoporosis Prevention : Discussed high calcium and vitamin D supplementation, weight bearing exercises Bone density :not applicable   Cervical cancer screening: Due  Skin cancer: Discussed monitoring for atypical lesions  Colorectal cancer: Discussed starting screening at age 57   Lung cancer:  Low Dose CT Chest recommended if Age 17-80 years, 20 pack-year currently smoking OR have quit w/in 15years. Patient does not qualify for screen   ECG: 10/09/21  Advanced Care Planning: A voluntary discussion about advance care planning including the explanation and discussion of advance directives.  Discussed health care proxy and Living will, and the patient was able to identify a health care proxy as aunt Kaitlin Johnson.  Patient does not have a living will and power of attorney of health care   Lipids: No results found for: "CHOL" No results found for: "HDL" No results found for: "LDLCALC" No results found for: "TRIG" No results found for: "CHOLHDL" No results found for: "LDLDIRECT"  Glucose: Glucose, Bld  Date Value Ref Range Status  10/08/2021 94 70 -  99 mg/dL Final    Comment:    Glucose reference range applies only to samples taken after fasting for at least 8 hours.  06/03/2021 80 65 - 99 mg/dL Final    Comment:    .            Fasting reference interval .     Patient Active Problem List   Diagnosis Date Noted   Low grade squamous intraepith lesion on cytologic smear cervix (lgsil) 08/05/2021   Anxiety 06/03/2021   Dry skin 06/03/2021    History reviewed. No pertinent surgical history.  History reviewed. No pertinent family history.  Social History   Socioeconomic History   Marital status: Single    Spouse name: Not on file   Number of children: Not on file   Years of education: Not on  file   Highest education level: Not on file  Occupational History   Not on file  Tobacco Use   Smoking status: Never   Smokeless tobacco: Never  Vaping Use   Vaping Use: Some days   Substances: Nicotine  Substance and Sexual Activity   Alcohol use: Yes    Comment: 10 glasses of wine or beer per week   Drug use: Yes    Types: Marijuana   Sexual activity: Yes  Other Topics Concern   Not on file  Social History Narrative   Not on file   Social Determinants of Health   Financial Resource Strain: Low Risk  (09/15/2022)   Overall Financial Resource Strain (CARDIA)    Difficulty of Paying Living Expenses: Not hard at all  Food Insecurity: No Food Insecurity (09/15/2022)   Hunger Vital Sign    Worried About Running Out of Food in the Last Year: Never true    Ran Out of Food in the Last Year: Never true  Transportation Needs: No Transportation Needs (09/15/2022)   PRAPARE - Administrator, Civil Service (Medical): No    Lack of Transportation (Non-Medical): No  Physical Activity: Sufficiently Active (09/15/2022)   Exercise Vital Sign    Days of Exercise per Week: 4 days    Minutes of Exercise per Session: 60 min  Stress: No Stress Concern Present (09/15/2022)   [employer] of Occupational Health - Occupational Stress Questionnaire    Feeling of Stress : Not at all  Social Connections: Socially Isolated (09/15/2022)   Social Connection and Isolation Panel [NHANES]    Frequency of Communication with Friends and Family: More than three times a week    Frequency of Social Gatherings with Friends and Family: More than three times a week    Attends Religious Services: Never    Database administrator or Organizations: No    Attends Banker Meetings: Never    Marital Status: Never married  Intimate Partner Violence: Not At Risk (09/15/2022)   Humiliation, Afraid, Rape, and Kick questionnaire    Fear of Current or Ex-Partner: No    Emotionally Abused: No     Physically Abused: No    Sexually Abused: No    No current outpatient medications on file.  No Known Allergies   Review of Systems  All other systems reviewed and are negative.    Objective  Vitals:   09/15/22 0836  BP: 116/84  Pulse: 100  Resp: 16  Temp: 97.9 F (36.6 C)  SpO2: 97%  Weight: 130 lb 14.4 oz (59.4 kg)  Height:  (1.651 m)    Body mass index  is 21.78 kg/m.  Physical Exam Exam conducted with a chaperone present.  Constitutional:      Appearance: Normal appearance.  HENT:     Head: Normocephalic and atraumatic.  Eyes:     Conjunctiva/sclera: Conjunctivae normal.  Cardiovascular:     Rate and Rhythm: Normal rate and regular rhythm.  Pulmonary:     Effort: Pulmonary effort is normal.     Breath sounds: Normal breath sounds.  Chest:  Breasts:    Right: Normal.     Left: Normal.  Genitourinary:    Comments: External genitalia within normal limits.  Vaginal mucosa pink, moist, normal rugae.  Nonfriable cervix without lesions, no discharge or bleeding noted on speculum exam.   Lymphadenopathy:     Upper Body:     Right upper body: No supraclavicular, axillary or pectoral adenopathy.     Left upper body: No supraclavicular, axillary or pectoral adenopathy.  Skin:    General: Skin is warm and dry.  Neurological:     General: No focal deficit present.     Mental Status: She is alert. Mental status is at baseline.  Psychiatric:        Mood and Affect: Mood normal.        Behavior: Behavior normal.      No results found for this or any previous visit (from the past 2160 hour(s)).   Fall Risk:    09/15/2022    8:37 AM 09/01/2022    2:21 PM 12/09/2021    1:07 PM 10/09/2021    2:01 PM 06/24/2021   12:59 PM  Fall Risk   Falls in the past year? 0 0 0 0 0  Number falls in past yr: 0 0 0 0 0  Injury with Fall? 0 0 0 0 0     Functional Status Survey: Is the patient deaf or have difficulty hearing?: No Does the patient have difficulty  seeing, even when wearing glasses/contacts?: No Does the patient have difficulty concentrating, remembering, or making decisions?: No Does the patient have difficulty walking or climbing stairs?: No Does the patient have difficulty dressing or bathing?: No Does the patient have difficulty doing errands alone such as visiting a doctor's office or shopping?: No   Assessment & Plan  1. Annual physical exam: Annual labs today.  - CBC w/Diff/Platelet - COMPLETE METABOLIC PANEL WITH GFR - Lipid Profile  2. Cervical cancer screening: Repeat Pap - history of abnormal cells, if this reoccurs will have to be re-evaluated by Gynecology. HPV vaccines up to date.   - Cytology - PAP  3. Drug screening, pre-employment: Urine screening ordered.   - DRUG MONITOR, PANEL 1, W/CONF, URINE   -USPSTF grade A and B recommendations reviewed with patient; age-appropriate recommendations, preventive care, screening tests, etc discussed and encouraged; healthy living encouraged; see AVS for patient education given to patient -Discussed importance of 150 minutes of physical activity weekly, eat two servings of fish weekly, eat one serving of tree nuts ( cashews, pistachios, pecans, almonds.Marland Kitchen) every other day, eat 6 servings of fruit/vegetables daily and drink plenty of water and avoid sweet beverages.   -Reviewed Health Maintenance: Yes.

## 2022-09-15 ENCOUNTER — Other Ambulatory Visit (HOSPITAL_COMMUNITY)
Admission: RE | Admit: 2022-09-15 | Discharge: 2022-09-15 | Disposition: A | Discharge status: Home / Self Care | Payer: BC Managed Care – PPO | Source: Ambulatory Visit | Attending: Internal Medicine | Admitting: Internal Medicine

## 2022-09-15 ENCOUNTER — Encounter: Payer: Self-pay | Admitting: Internal Medicine

## 2022-09-15 ENCOUNTER — Ambulatory Visit (INDEPENDENT_AMBULATORY_CARE_PROVIDER_SITE_OTHER): Payer: BC Managed Care – PPO | Admitting: Internal Medicine

## 2022-09-15 VITALS — BP 116/84 | HR 100 | Temp 97.9°F | Resp 16 | Ht 65.0 in | Wt 130.9 lb

## 2022-09-15 DIAGNOSIS — Z0000 Encounter for general adult medical examination without abnormal findings: Secondary | ICD-10-CM | POA: Insufficient documentation

## 2022-09-15 DIAGNOSIS — Z021 Encounter for pre-employment examination: Secondary | ICD-10-CM

## 2022-09-15 DIAGNOSIS — Z124 Encounter for screening for malignant neoplasm of cervix: Secondary | ICD-10-CM | POA: Insufficient documentation

## 2022-09-15 DIAGNOSIS — E7800 Pure hypercholesterolemia, unspecified: Secondary | ICD-10-CM | POA: Diagnosis not present

## 2022-09-17 LAB — DRUG MONITOR, PANEL 1, W/CONF, URINE
Amphetamines: NEGATIVE ng/mL (ref <500)
Barbiturates: NEGATIVE ng/mL (ref <300)
Benzodiazepines: NEGATIVE ng/mL (ref <100)
Cocaine Metabolite: NEGATIVE ng/mL (ref <150)
Creatinine: 142.9 mg/dL (ref >=20.0)
Marijuana Metabolite: 28 ng/mL — ABNORMAL HIGH (ref <5)
Marijuana Metabolite: POSITIVE ng/mL — AB (ref <20)
Methadone Metabolite: NEGATIVE ng/mL (ref <100)
Opiates: NEGATIVE ng/mL (ref <100)
Oxidant: NEGATIVE ug/mL (ref <200)
Oxycodone: NEGATIVE ng/mL (ref <100)
Phencyclidine: NEGATIVE ng/mL (ref <25)
pH: 5.2 (ref 4.5–9.0)

## 2022-09-17 LAB — CBC WITH DIFFERENTIAL/PLATELET
Absolute Monocytes: 636 cells/uL (ref 200–950)
Basophils Absolute: 21 cells/uL (ref 0–200)
Basophils Relative: 0.5 %
Eosinophils Absolute: 103 cells/uL (ref 15–500)
Eosinophils Relative: 2.5 %
HCT: 39.1 % (ref 35.0–45.0)
Hemoglobin: 13.2 g/dL (ref 11.7–15.5)
Lymphs Abs: 2030 cells/uL (ref 850–3900)
MCH: 31.3 pg (ref 27.0–33.0)
MCHC: 33.8 g/dL (ref 32.0–36.0)
MCV: 92.7 fL (ref 80.0–100.0)
MPV: 11.2 fL (ref 7.5–12.5)
Monocytes Relative: 15.5 %
Neutro Abs: 1312 cells/uL — ABNORMAL LOW (ref 1500–7800)
Neutrophils Relative %: 32 %
Platelets: 227 10*3/uL (ref 140–400)
RBC: 4.22 10*6/uL (ref 3.80–5.10)
RDW: 11.8 % (ref 11.0–15.0)
Total Lymphocyte: 49.5 %
WBC: 4.1 10*3/uL (ref 3.8–10.8)

## 2022-09-17 LAB — LIPID PANEL
Cholesterol: 160 mg/dL (ref <200)
HDL: 79 mg/dL (ref >=50)
LDL Cholesterol (Calc): 68 mg/dL (calc)
Non-HDL Cholesterol (Calc): 81 mg/dL (calc) (ref <130)
Total CHOL/HDL Ratio: 2 (calc) (ref <5.0)
Triglycerides: 51 mg/dL (ref <150)

## 2022-09-17 LAB — COMPLETE METABOLIC PANEL WITH GFR
AG Ratio: 1.8 (calc) (ref 1.0–2.5)
ALT: 10 U/L (ref 6–29)
AST: 15 U/L (ref 10–30)
Albumin: 4.7 g/dL (ref 3.6–5.1)
Alkaline phosphatase (APISO): 29 U/L — ABNORMAL LOW (ref 31–125)
BUN: 12 mg/dL (ref 7–25)
CO2: 29 mmol/L (ref 20–32)
Calcium: 9.7 mg/dL (ref 8.6–10.2)
Chloride: 103 mmol/L (ref 98–110)
Creat: 0.68 mg/dL (ref 0.50–0.96)
Globulin: 2.6 g/dL (calc) (ref 1.9–3.7)
Glucose, Bld: 80 mg/dL (ref 65–99)
Potassium: 5 mmol/L (ref 3.5–5.3)
Sodium: 139 mmol/L (ref 135–146)
Total Bilirubin: 0.3 mg/dL (ref 0.2–1.2)
Total Protein: 7.3 g/dL (ref 6.1–8.1)
eGFR: 125 mL/min/{1.73_m2} (ref >=60)

## 2022-09-17 LAB — DM TEMPLATE

## 2022-09-19 ENCOUNTER — Encounter: Payer: Self-pay | Admitting: Internal Medicine

## 2022-09-19 LAB — CYTOLOGY - PAP: Adequacy: ABSENT

## 2022-09-28 DIAGNOSIS — R1033 Periumbilical pain: Secondary | ICD-10-CM | POA: Diagnosis not present

## 2022-09-28 DIAGNOSIS — R110 Nausea: Secondary | ICD-10-CM | POA: Diagnosis not present

## 2022-11-01 DIAGNOSIS — S81801A Unspecified open wound, right lower leg, initial encounter: Secondary | ICD-10-CM | POA: Diagnosis not present

## 2022-12-09 ENCOUNTER — Encounter: Payer: BC Managed Care – PPO | Admitting: Obstetrics and Gynecology

## 2022-12-09 ENCOUNTER — Telehealth: Payer: Self-pay | Admitting: Obstetrics and Gynecology

## 2022-12-09 NOTE — Progress Notes (Deleted)
    GYNECOLOGY OFFICE COLPOSCOPY PROCEDURE NOTE  22 y.o. No obstetric history on file. 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 Discussed role for HPV in cervical dysplasia, need for surveillance.  Patient gave informed written consent, time out was performed.  Placed in lithotomy position. Cervix viewed with speculum and colposcope after application of acetic acid.   Colposcopy adequate? {yes/no:20286}  {Findings; colposcopy:728}; corresponding biopsies obtained.  ECC specimen obtained. All specimens were labeled and sent to pathology.  Chaperone was present during entire procedure.  Patient was given post procedure instructions.  Will follow up pathology and manage accordingly; patient will be contacted with results and recommendations.  Routine preventative health maintenance measures emphasized.    Hildred Laser, MD [facility]

## 2022-12-09 NOTE — Telephone Encounter (Signed)
Reached out to pt to reschedule colpo that was scheduled on 12/09/2022 with Dr. Valentino Saxon at 3:15.  Pt stated that she meant to call and cancel.  Pt also stated that she would call back to reschedule the appt bc she was at work.

## 2022-12-10 ENCOUNTER — Encounter: Payer: Self-pay | Admitting: Obstetrics and Gynecology

## 2022-12-10 NOTE — Telephone Encounter (Signed)
Reached out to pt (2x) to reschedule colpo that was scheduled on 12/09/2022 with Dr. Valentino Saxon at 3:15.  Left message for pt to call back to reschedule.  Will send a MyChart letter.

## 2023-07-01 IMAGING — CR DG CHEST 2V
2 series · 2 of 2 positions shown · non-contrast
Comparison: None Available.

CLINICAL DATA: Shortness of breath

EXAM:
CHEST - 2 VIEW

[chest pa]
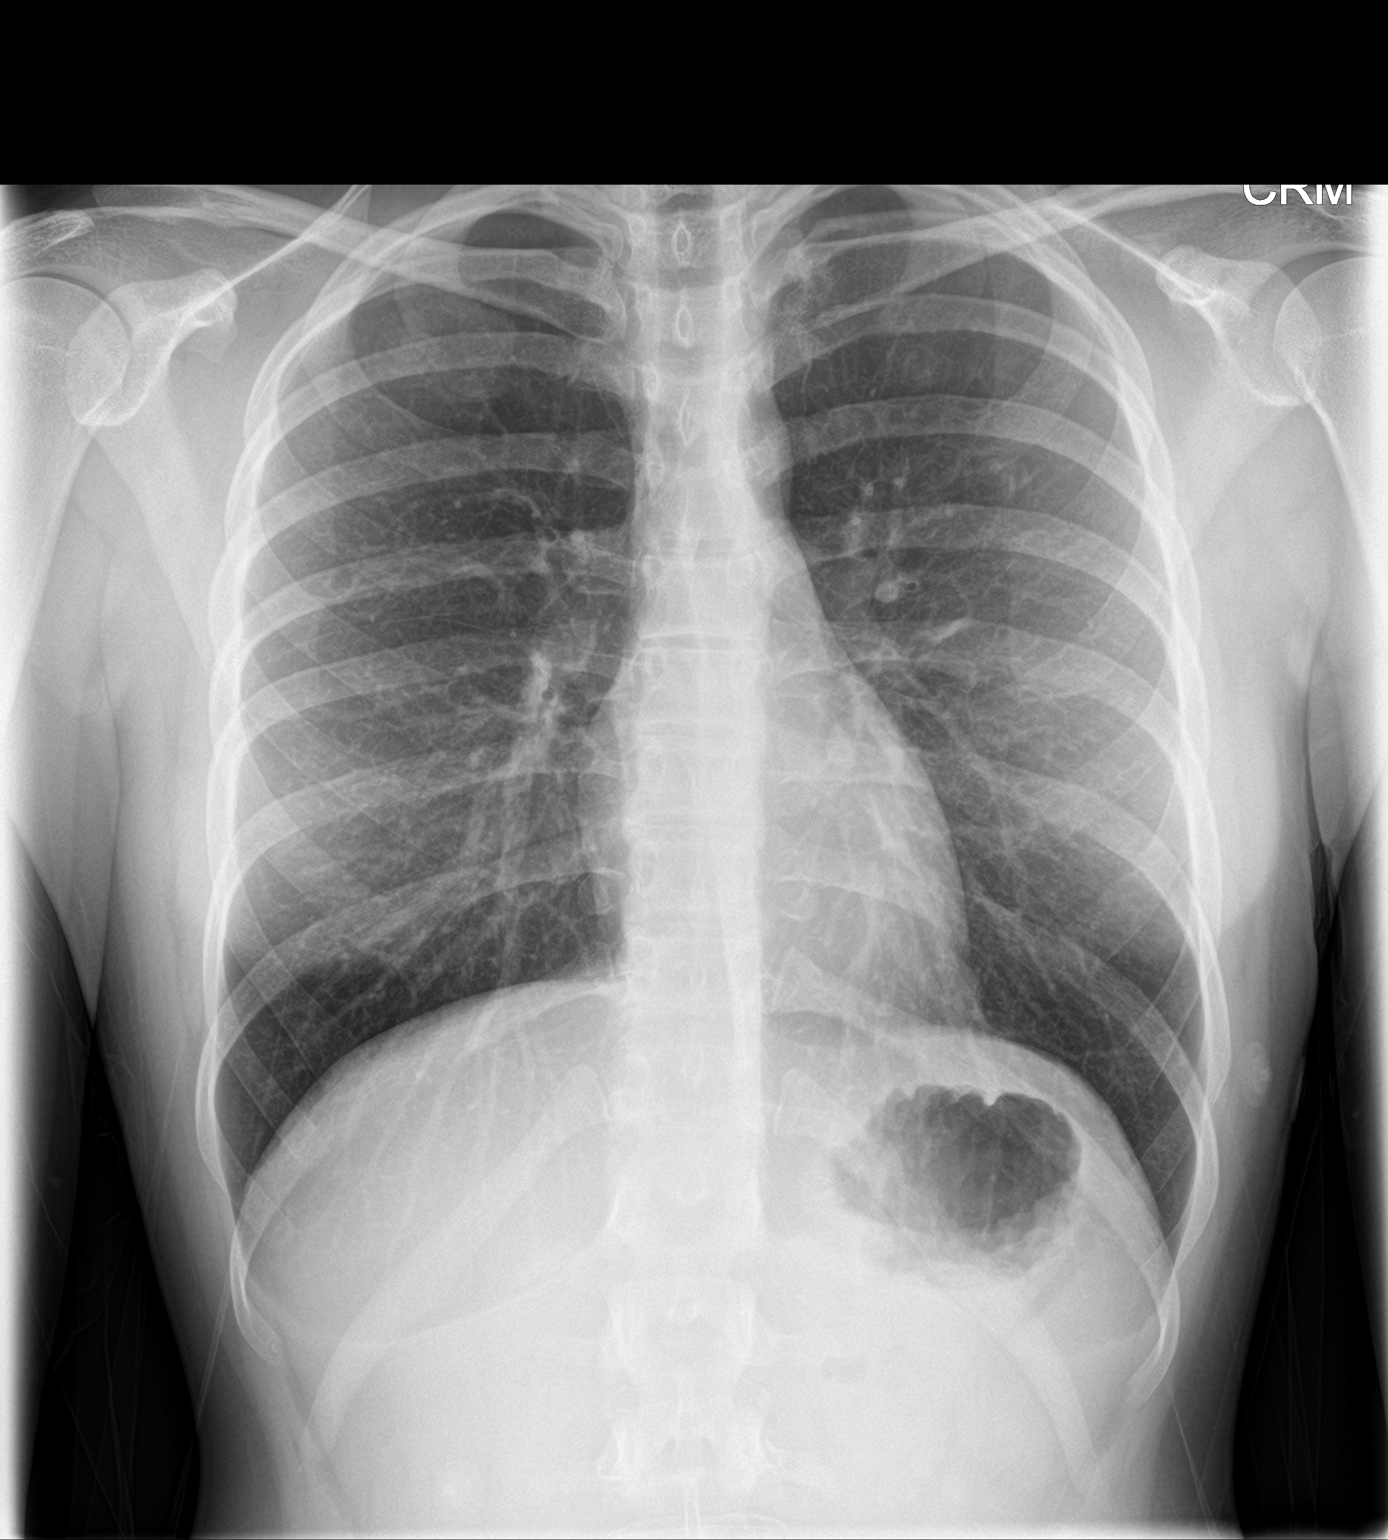

[chest lat]
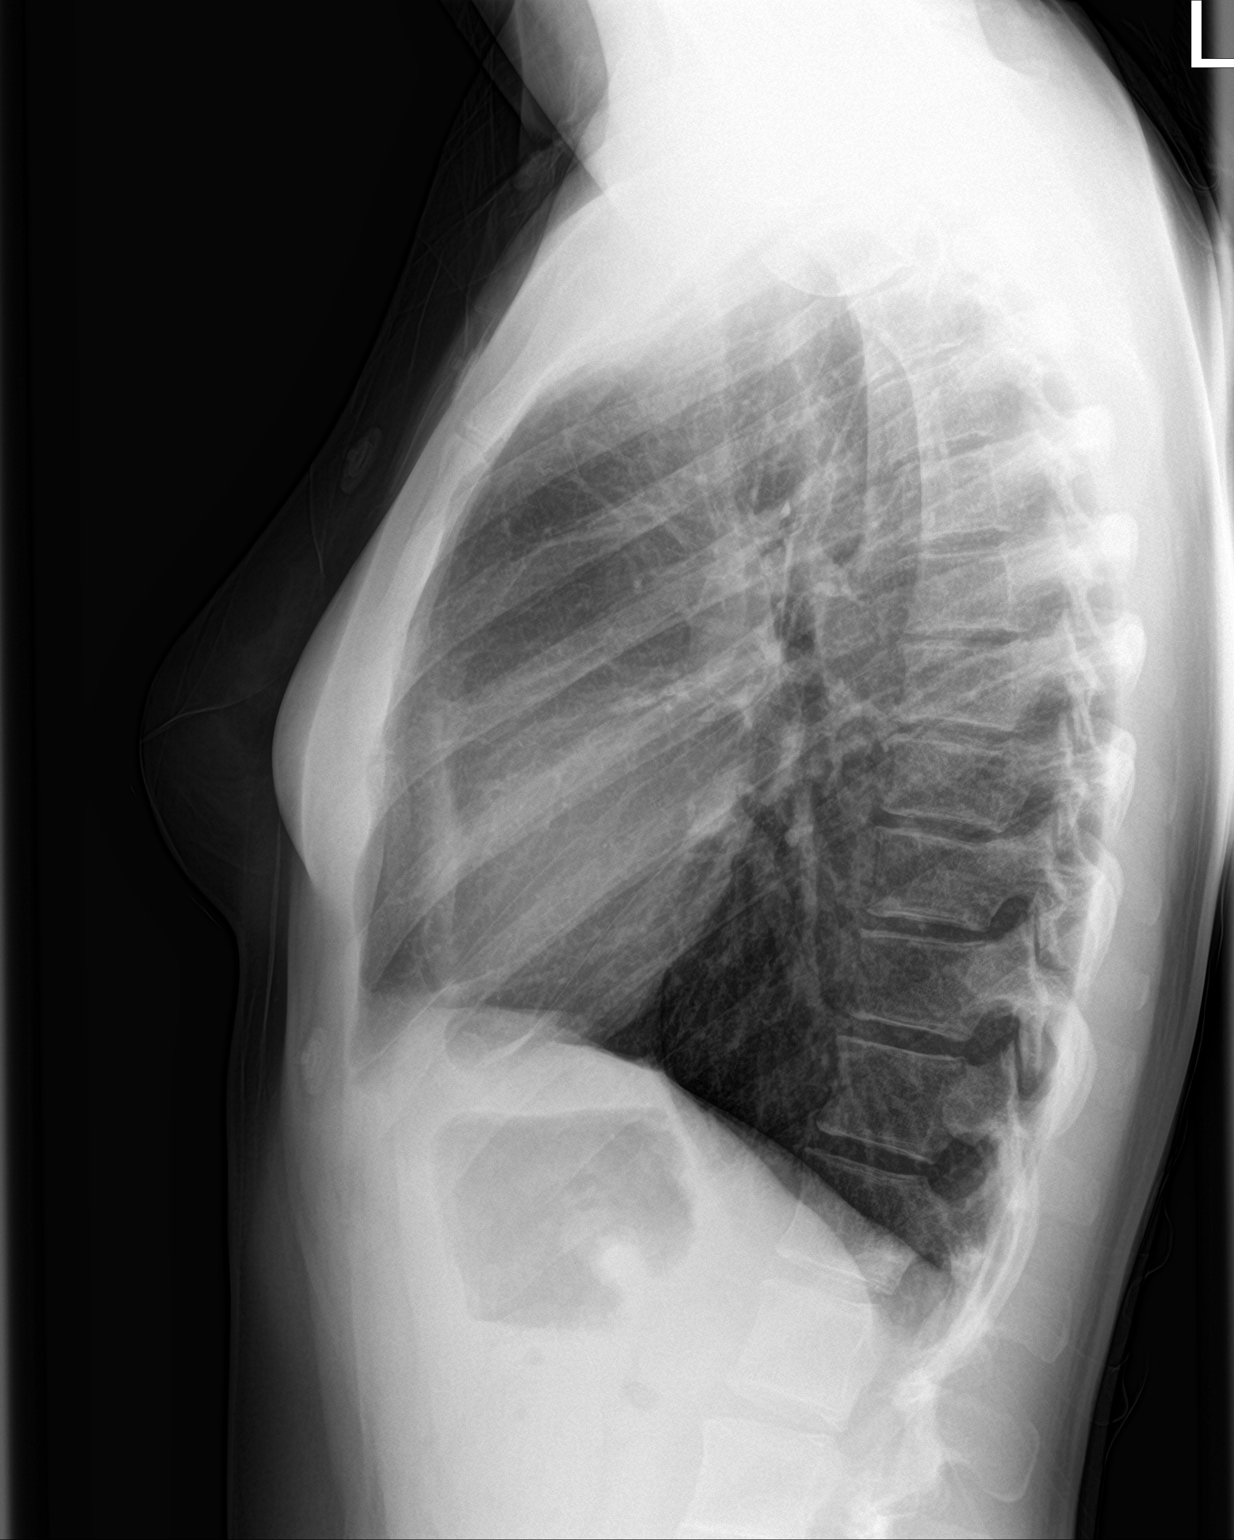

[2 of 2 positions shown; findings below may reference images not displayed]

FINDINGS: Cardiac and mediastinal contours are within normal limits. No focal
pulmonary opacity. No pleural effusion or pneumothorax. No acute
osseous abnormality.
IMPRESSION: No acute cardiopulmonary process.
